# Patient Record
Sex: Male | Born: 1980 | Race: Black or African American | Hispanic: No | Marital: Single | Smoking: Current every day smoker
Health system: Southern US, Community
[De-identification: ages and names within clinical notes are randomized; demographics above are authoritative.]

## PROBLEM LIST (undated history)

## (undated) ENCOUNTER — Emergency Department (HOSPITAL_COMMUNITY): Disposition: A | Discharge status: Home / Self Care | Payer: Self-pay

## (undated) DIAGNOSIS — G4730 Sleep apnea, unspecified: Secondary | ICD-10-CM

---

## 2011-10-13 ENCOUNTER — Encounter: Payer: Self-pay | Admitting: Emergency Medicine

## 2011-10-13 ENCOUNTER — Emergency Department (HOSPITAL_COMMUNITY)
Admission: EM | Admit: 2011-10-13 | Discharge: 2011-10-14 | Discharge status: Against Medical Advice | Payer: Self-pay | Attending: Emergency Medicine | Admitting: Emergency Medicine

## 2011-10-13 DIAGNOSIS — Z0389 Encounter for observation for other suspected diseases and conditions ruled out: Secondary | ICD-10-CM | POA: Insufficient documentation
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
## 2011-10-14 ENCOUNTER — Ambulatory Visit (HOSPITAL_COMMUNITY): Admission: RE | Admit: 2011-10-14 | Payer: Self-pay | Source: Ambulatory Visit

## 2011-10-29 ENCOUNTER — Encounter (HOSPITAL_COMMUNITY): Payer: Self-pay | Admitting: *Deleted

## 2011-10-29 ENCOUNTER — Emergency Department (HOSPITAL_COMMUNITY): Payer: Self-pay

## 2011-10-29 ENCOUNTER — Emergency Department (HOSPITAL_COMMUNITY)
Admission: EM | Admit: 2011-10-29 | Discharge: 2011-10-29 | Disposition: A | Discharge status: Home / Self Care | Payer: Self-pay | Attending: Emergency Medicine | Admitting: Emergency Medicine

## 2011-10-29 DIAGNOSIS — F17200 Nicotine dependence, unspecified, uncomplicated: Secondary | ICD-10-CM | POA: Insufficient documentation

## 2011-10-29 DIAGNOSIS — S62309A Unspecified fracture of unspecified metacarpal bone, initial encounter for closed fracture: Secondary | ICD-10-CM

## 2011-10-29 DIAGNOSIS — M79609 Pain in unspecified limb: Secondary | ICD-10-CM | POA: Insufficient documentation

## 2011-10-29 DIAGNOSIS — M7989 Other specified soft tissue disorders: Secondary | ICD-10-CM | POA: Insufficient documentation

## 2011-10-29 DIAGNOSIS — S62319A Displaced fracture of base of unspecified metacarpal bone, initial encounter for closed fracture: Secondary | ICD-10-CM | POA: Insufficient documentation

## 2011-10-29 MED ORDER — OXYCODONE-ACETAMINOPHEN 5-325 MG PO TABS: 2.0000 | ORAL_TABLET | ORAL | Status: DC | PRN

## 2011-10-29 NOTE — ED Notes (Signed)
Returned from xray
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
## 2011-10-29 NOTE — ED Notes (Signed)
Assumed care of pt.  No distress noted.  Pt noted to be walking around dept without difficulty.  Pt updated on POC.
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
## 2011-10-30 ENCOUNTER — Encounter (HOSPITAL_COMMUNITY): Payer: Self-pay | Admitting: Certified Registered Nurse Anesthetist

## 2011-10-30 ENCOUNTER — Emergency Department (HOSPITAL_COMMUNITY)
Admission: EM | Admit: 2011-10-30 | Discharge: 2011-10-30 | Disposition: A | Discharge status: Home / Self Care | Payer: Self-pay | Attending: Orthopedic Surgery | Admitting: Orthopedic Surgery

## 2011-10-30 ENCOUNTER — Emergency Department (HOSPITAL_COMMUNITY): Payer: Self-pay | Admitting: Certified Registered Nurse Anesthetist

## 2011-10-30 ENCOUNTER — Encounter (HOSPITAL_COMMUNITY): Admission: EM | Disposition: A | Discharge status: Home / Self Care | Payer: Self-pay | Source: Home / Self Care

## 2011-10-30 ENCOUNTER — Inpatient Hospital Stay: Admission: AD | Admit: 2011-10-30 | Payer: Self-pay | Source: Ambulatory Visit | Admitting: Orthopedic Surgery

## 2011-10-30 DIAGNOSIS — S62309A Unspecified fracture of unspecified metacarpal bone, initial encounter for closed fracture: Secondary | ICD-10-CM

## 2011-10-30 DIAGNOSIS — M79609 Pain in unspecified limb: Secondary | ICD-10-CM | POA: Insufficient documentation

## 2011-10-30 DIAGNOSIS — S62319A Displaced fracture of base of unspecified metacarpal bone, initial encounter for closed fracture: Secondary | ICD-10-CM

## 2011-10-30 SURGERY — OPEN REDUCTION INTERNAL FIXATION (ORIF) HAND
Anesthesia: General | Site: Hand | Laterality: Left | Wound class: Clean

## 2011-10-30 MED ORDER — DEXAMETHASONE SODIUM PHOSPHATE 4 MG/ML IJ SOLN
INTRAMUSCULAR | Status: DC | PRN
  Administered 2011-10-30: 10 mg via INTRAVENOUS

## 2011-10-30 MED ORDER — 0.9 % SODIUM CHLORIDE (POUR BTL) OPTIME
TOPICAL | Status: DC | PRN
  Administered 2011-10-30: 1000 mL

## 2011-10-30 MED ORDER — FENTANYL CITRATE 0.05 MG/ML IJ SOLN: 25.0000 ug | INTRAMUSCULAR | Status: DC | PRN

## 2011-10-30 MED ORDER — DOCUSATE SODIUM 100 MG PO CAPS: 100.0000 mg | ORAL_CAPSULE | Freq: Two times a day (BID) | ORAL | Status: AC

## 2011-10-30 MED ORDER — CEFAZOLIN SODIUM-DEXTROSE 2-3 GM-% IV SOLR
2.0000 g | Freq: Once | INTRAVENOUS | Status: DC
  Filled 2011-10-30: qty 50

## 2011-10-30 MED ORDER — LACTATED RINGERS IV SOLN
INTRAVENOUS | Status: DC | PRN
  Administered 2011-10-30: 10:00:00 via INTRAVENOUS

## 2011-10-30 MED ORDER — EPHEDRINE SULFATE 50 MG/ML IJ SOLN
INTRAMUSCULAR | Status: DC | PRN
  Administered 2011-10-30 (×2): 10 mg via INTRAVENOUS

## 2011-10-30 MED ORDER — MIDAZOLAM HCL 5 MG/5ML IJ SOLN
INTRAMUSCULAR | Status: DC | PRN
  Administered 2011-10-30: 2 mg via INTRAVENOUS
  Administered 2011-10-30: 1 mg via INTRAVENOUS

## 2011-10-30 MED ORDER — SODIUM CHLORIDE 0.9 % IV SOLN
10.0000 mg | INTRAVENOUS | Status: DC | PRN
  Administered 2011-10-30: 80 ug/min via INTRAVENOUS

## 2011-10-30 MED ORDER — PHENYLEPHRINE HCL 10 MG/ML IJ SOLN
INTRAMUSCULAR | Status: DC | PRN
  Administered 2011-10-30 (×3): 120 ug via INTRAVENOUS

## 2011-10-30 MED ORDER — METOCLOPRAMIDE HCL 5 MG/ML IJ SOLN
10.0000 mg | Freq: Once | INTRAMUSCULAR | Status: DC | PRN
  Filled 2011-10-30: qty 2

## 2011-10-30 MED ORDER — ONDANSETRON HCL 4 MG/2ML IJ SOLN
INTRAMUSCULAR | Status: DC | PRN
  Administered 2011-10-30: 4 mg via INTRAVENOUS

## 2011-10-30 MED ORDER — LIDOCAINE HCL 1 % IJ SOLN
INTRAMUSCULAR | Status: DC | PRN
  Administered 2011-10-30: 1 mL via INTRADERMAL

## 2011-10-30 MED ORDER — OXYCODONE-ACETAMINOPHEN 10-325 MG PO TABS: 1.0000 | ORAL_TABLET | ORAL | Status: AC | PRN

## 2011-10-30 MED ORDER — LACTATED RINGERS IV SOLN
INTRAVENOUS | Status: DC | PRN
  Administered 2011-10-30 (×2): via INTRAVENOUS

## 2011-10-30 MED ORDER — FENTANYL CITRATE 0.05 MG/ML IJ SOLN
INTRAMUSCULAR | Status: DC | PRN
  Administered 2011-10-30 (×2): 50 ug via INTRAVENOUS
  Administered 2011-10-30: 100 ug via INTRAVENOUS

## 2011-10-30 MED ORDER — ROPIVACAINE HCL 5 MG/ML IJ SOLN
INTRAMUSCULAR | Status: DC | PRN
  Administered 2011-10-30: 30 mL via EPIDURAL

## 2011-10-30 MED ORDER — ALBUTEROL SULFATE HFA 108 (90 BASE) MCG/ACT IN AERS
INHALATION_SPRAY | RESPIRATORY_TRACT | Status: DC | PRN
  Administered 2011-10-30: 4 via RESPIRATORY_TRACT

## 2011-10-30 MED ORDER — MORPHINE SULFATE 2 MG/ML IJ SOLN: 0.0500 mg/kg | INTRAMUSCULAR | Status: DC | PRN

## 2011-10-30 SURGICAL SUPPLY — 68 items
BANDAGE ELASTIC 3 VELCRO ST LF (GAUZE/BANDAGES/DRESSINGS) ×3 IMPLANT
BANDAGE ELASTIC 4 VELCRO ST LF (GAUZE/BANDAGES/DRESSINGS) ×3 IMPLANT
BANDAGE GAUZE ELAST BULKY 4 IN (GAUZE/BANDAGES/DRESSINGS) ×3 IMPLANT
BIT DRILL 2 FAST STEP (BIT) ×2 IMPLANT
BIT DRILL 2 MINI QC DISP (BIT) ×2 IMPLANT
BLADE SURG ROTATE 9660 (MISCELLANEOUS) IMPLANT
BNDG CMPR 9X4 STRL LF SNTH (GAUZE/BANDAGES/DRESSINGS) ×2
BNDG ESMARK 4X9 LF (GAUZE/BANDAGES/DRESSINGS) ×3 IMPLANT
CLOTH BEACON ORANGE TIMEOUT ST (SAFETY) ×3 IMPLANT
CORDS BIPOLAR (ELECTRODE) ×3 IMPLANT
COVER SURGICAL LIGHT HANDLE (MISCELLANEOUS) ×3 IMPLANT
CUFF TOURNIQUET SINGLE 18IN (TOURNIQUET CUFF) ×3 IMPLANT
CUFF TOURNIQUET SINGLE 24IN (TOURNIQUET CUFF) IMPLANT
DRAIN TLS ROUND 10FR (DRAIN) IMPLANT
DRAPE OEC MINIVIEW 54X84 (DRAPES) ×3 IMPLANT
DRAPE SURG 17X11 SM STRL (DRAPES) ×3 IMPLANT
DRSG ADAPTIC 3X8 NADH LF (GAUZE/BANDAGES/DRESSINGS) ×3 IMPLANT
DRSG EMULSION OIL 3X3 NADH (GAUZE/BANDAGES/DRESSINGS) ×2 IMPLANT
ELECT REM PT RETURN 9FT ADLT (ELECTROSURGICAL)
ELECTRODE REM PT RTRN 9FT ADLT (ELECTROSURGICAL) IMPLANT
GAUZE SPONGE 4X4 12PLY STRL LF (GAUZE/BANDAGES/DRESSINGS) ×2 IMPLANT
GAUZE SPONGE 4X4 16PLY XRAY LF (GAUZE/BANDAGES/DRESSINGS) ×3 IMPLANT
GLOVE BIOGEL PI IND STRL 8.5 (GLOVE) ×2 IMPLANT
GLOVE BIOGEL PI INDICATOR 8.5 (GLOVE) ×1
GLOVE SURG ORTHO 8.0 STRL STRW (GLOVE) ×7 IMPLANT
GOWN PREVENTION PLUS XLARGE (GOWN DISPOSABLE) ×7 IMPLANT
GOWN STRL NON-REIN LRG LVL3 (GOWN DISPOSABLE) ×3 IMPLANT
K-WIRE .062 (WIRE) ×3
K-WIRE FX6X.062X2 END TROC (WIRE) ×2
KIT BASIN OR (CUSTOM PROCEDURE TRAY) ×3 IMPLANT
KIT ROOM TURNOVER OR (KITS) ×3 IMPLANT
KWIRE FX6X.062X2 END TROC (WIRE) ×2 IMPLANT
MANIFOLD NEPTUNE II (INSTRUMENTS) ×3 IMPLANT
NDL HYPO 25X1 1.5 SAFETY (NEEDLE) ×1 IMPLANT
NEEDLE HYPO 25X1 1.5 SAFETY (NEEDLE) IMPLANT
NS IRRIG 1000ML POUR BTL (IV SOLUTION) ×3 IMPLANT
PACK ORTHO EXTREMITY (CUSTOM PROCEDURE TRAY) ×3 IMPLANT
PAD ARMBOARD 7.5X6 YLW CONV (MISCELLANEOUS) ×6 IMPLANT
PAD CAST 4YDX4 CTTN HI CHSV (CAST SUPPLIES) ×2 IMPLANT
PADDING CAST ABS 4INX4YD NS (CAST SUPPLIES) ×1
PADDING CAST ABS COTTON 4X4 ST (CAST SUPPLIES) ×2 IMPLANT
PADDING CAST COTTON 4X4 STRL (CAST SUPPLIES) ×3
PLATE T SHAPE LOCKING 2.5MM (Plate) ×2 IMPLANT
SCREW PEG 15MM (Screw) ×2 IMPLANT
SCREW PEG 2.5X14 NONLOCK (Screw) ×2 IMPLANT
SCREW PEG 2.5X16 NONLOCK (Screw) ×3 IMPLANT
SCREW PEG 2.5X18 NONLOCK (Screw) ×2 IMPLANT
SCREW PEG 2.5X24 NONLOCK (Screw) ×4 IMPLANT
SCREW PEG LOCK 2.5X12 (Screw) ×3 IMPLANT
SCREW PEG LOCK 2.5X20 (Peg) ×2 IMPLANT
SOAP 2 % CHG 4 OZ (WOUND CARE) ×3 IMPLANT
SPLINT FIBERGLASS 4X30 (CAST SUPPLIES) ×2 IMPLANT
SPONGE GAUZE 4X4 12PLY (GAUZE/BANDAGES/DRESSINGS) ×3 IMPLANT
STRIP CLOSURE SKIN 1/2X4 (GAUZE/BANDAGES/DRESSINGS) IMPLANT
SUCTION FRAZIER TIP 10 FR DISP (SUCTIONS) ×3 IMPLANT
SUT ETHILON 4 0 PS 2 18 (SUTURE) ×3 IMPLANT
SUT MNCRL AB 4-0 PS2 18 (SUTURE) ×2 IMPLANT
SUT PROLENE 4 0 PS 2 18 (SUTURE) ×3 IMPLANT
SUT VIC AB 2-0 FS1 27 (SUTURE) ×3 IMPLANT
SUT VICRYL 4-0 PS2 18IN ABS (SUTURE) ×3 IMPLANT
SYR CONTROL 10ML LL (SYRINGE) IMPLANT
SYSTEM CHEST DRAIN TLS 7FR (DRAIN) IMPLANT
TOWEL OR 17X24 6PK STRL BLUE (TOWEL DISPOSABLE) ×3 IMPLANT
TOWEL OR 17X26 10 PK STRL BLUE (TOWEL DISPOSABLE) ×3 IMPLANT
TUBE CONNECTING 12X1/4 (SUCTIONS) ×3 IMPLANT
WASHER 2.5 THREADED (Orthopedic Implant) ×2 IMPLANT
WATER STERILE IRR 1000ML POUR (IV SOLUTION) ×1 IMPLANT
YANKAUER SUCT BULB TIP NO VENT (SUCTIONS) IMPLANT

## 2011-10-30 NOTE — Transfer of Care (Signed)
Immediate Anesthesia Transfer of Care Note  Patient: Connor Porter  Procedure(s) Performed:  OPEN REDUCTION INTERNAL FIXATION (ORIF) HAND  Patient Location: PACU  Anesthesia Type: General  Level of Consciousness: awake, alert , oriented and patient cooperative  Airway & Oxygen Therapy: Patient Spontanous Breathing and Patient connected to nasal cannula oxygen  Post-op Assessment: Report given to PACU RN and Post -op Vital signs reviewed and stable  Post vital signs: Reviewed and stable  Complications: No apparent anesthesia complications

## 2011-10-30 NOTE — Brief Op Note (Signed)
10/30/2011  12:47 PM  PATIENT:  Connor Porter  30 y.o. male  PRE-OPERATIVE DIAGNOSIS:  Left hand Fracture  POST-OPERATIVE DIAGNOSIS:  Left hand fracture  PROCEDURE:  Procedure(s): OPEN REDUCTION INTERNAL FIXATION (ORIF) HAND  SURGEON:  Surgeon(s): Sharma Covert  PHYSICIAN ASSISTANT:   ASSISTANTS: none   ANESTHESIA:   general  EBL:  Total I/O In: 1000 [I.V.:1000] Out: -   BLOOD ADMINISTERED:none  DRAINS: none   LOCAL MEDICATIONS USED:  NONE  SPECIMEN:  No Specimen  DISPOSITION OF SPECIMEN:  N/A  COUNTS:  YES  TOURNIQUET:  * Missing tourniquet times found for documented tourniquets in log:  [id number] *  DICTATION: .typed in epic  PLAN OF CARE: Discharge to home after PACU  PATIENT DISPOSITION:  PACU - hemodynamically stable.   Delay start of Pharmacological VTE agent (>24hrs) due to surgical blood loss or risk of bleeding:  {YES/NO/NOT APPLICABLE:20182
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
## 2011-10-30 NOTE — Anesthesia Postprocedure Evaluation (Signed)
  Anesthesia Post Note  Patient: Connor Porter  Procedure(s) Performed:  OPEN REDUCTION INTERNAL FIXATION (ORIF) HAND  Anesthesia type: General  Patient location: PACU  Post pain: Pain level controlled  Post assessment: Patient's Cardiovascular Status Stable  Last Vitals:  Filed Vitals:   10/30/11 1300  Pulse: 90  Temp: 36.9 C  Resp: 21    Post vital signs: Reviewed and stable  Level of consciousness: alert  Complications: No apparent anesthesia complications

## 2011-10-30 NOTE — Anesthesia Procedure Notes (Addendum)
Anesthesia Regional Block:  Supraclavicular block  Pre-Anesthetic Checklist: ,, timeout performed, Correct Patient, Correct Site, Correct Laterality, Correct Procedure, Correct Position, site marked, Risks and benefits discussed,  Surgical consent,  Pre-op evaluation,  At surgeon's request and post-op pain management  Laterality: Left  Prep: chloraprep       Needles:   Needle Type: Other   (Arrow Echogenic)   Needle Length: 9cm  Needle Gauge: 21    Additional Needles:  Procedures: ultrasound guided Supraclavicular block Narrative:  Start time: 10/30/2011 10:05 AM End time: 10/30/2011 10:14 AM Injection made incrementally with aspirations every 5 mL.  Performed by: Personally  Anesthesiologist: C Frederick  Additional Notes: Ultrasound guidance used to: id relevant anatomy, confirm needle position, local anesthetic spread, avoidance of vascular puncture. Picture saved. No complications. Block performed personally by Janetta Hora. Gelene Mink, MD    Supraclavicular block Procedure Name: LMA Insertion Date/Time: 10/30/2011 10:48 AM Performed by: Benedetto Goad Pre-anesthesia Checklist: Patient identified, Emergency Drugs available, Suction available, Patient being monitored and Timeout performed Patient Re-evaluated:Patient Re-evaluated prior to inductionOxygen Delivery Method: Circle System Utilized Preoxygenation: Pre-oxygenation with 100% oxygen Intubation Type: IV induction LMA: LMA with gastric port inserted LMA Size: 5.0 Number of attempts: 1 Tube secured with: Tape Dental Injury: Teeth and Oropharynx as per pre-operative assessment

## 2011-10-30 NOTE — Anesthesia Preprocedure Evaluation (Addendum)
Anesthesia Evaluation  Patient identified by MRN, date of birth, ID band Patient awake    Reviewed: Allergy & Precautions, H&P , NPO status , Patient's Chart, lab work & pertinent test results, reviewed documented beta blocker date and time   Airway Mallampati: II TM Distance: >3 FB Neck ROM: full    Dental  (+) Dental Advisory Given   Pulmonary neg pulmonary ROS, Current Smoker (ppd),          Cardiovascular neg cardio ROS     Neuro/Psych Negative Neurological ROS  Negative Psych ROS   GI/Hepatic negative GI ROS, Neg liver ROS,   Endo/Other  Negative Endocrine ROS  Renal/GU negative Renal ROS  Genitourinary negative   Musculoskeletal   Abdominal   Peds  Hematology negative hematology ROS (+)   Anesthesia Other Findings See surgeon's H&P   Reproductive/Obstetrics negative OB ROS                         Anesthesia Physical Anesthesia Plan  ASA: I  Anesthesia Plan: General   Post-op Pain Management: MAC Combined w/ Regional for Post-op pain   Induction: Intravenous  Airway Management Planned: LMA  Additional Equipment:   Intra-op Plan:   Post-operative Plan: Extubation in OR  Informed Consent: I have reviewed the patients History and Physical, chart, labs and discussed the procedure including the risks, benefits and alternatives for the proposed anesthesia with the patient or authorized representative who has indicated his/her understanding and acceptance.     Plan Discussed with: CRNA, Surgeon and Anesthesiologist  Anesthesia Plan Comments:        Anesthesia Quick Evaluation

## 2011-10-30 NOTE — Preoperative (Signed)
Beta Blockers   Reason not to administer Beta Blockers:Not Applicable 

## 2011-10-31 MED FILL — Cefazolin in D5W Inj 1 GM/50ML: INTRAVENOUS | Qty: 100 | Status: AC

## 2019-08-06 ENCOUNTER — Encounter (HOSPITAL_COMMUNITY): Payer: Self-pay | Admitting: Emergency Medicine

## 2019-08-06 ENCOUNTER — Emergency Department (HOSPITAL_COMMUNITY): Payer: Self-pay

## 2019-08-06 ENCOUNTER — Emergency Department (HOSPITAL_COMMUNITY)
Admission: EM | Admit: 2019-08-06 | Discharge: 2019-08-07 | Disposition: A | Discharge status: Home / Self Care | Payer: Self-pay | Attending: Emergency Medicine | Admitting: Emergency Medicine

## 2019-08-06 ENCOUNTER — Other Ambulatory Visit: Payer: Self-pay

## 2019-08-06 DIAGNOSIS — Y929 Unspecified place or not applicable: Secondary | ICD-10-CM | POA: Insufficient documentation

## 2019-08-06 DIAGNOSIS — S61412A Laceration without foreign body of left hand, initial encounter: Secondary | ICD-10-CM | POA: Insufficient documentation

## 2019-08-06 DIAGNOSIS — S60222A Contusion of left hand, initial encounter: Secondary | ICD-10-CM

## 2019-08-06 DIAGNOSIS — Y939 Activity, unspecified: Secondary | ICD-10-CM | POA: Insufficient documentation

## 2019-08-06 DIAGNOSIS — W51XXXA Accidental striking against or bumped into by another person, initial encounter: Secondary | ICD-10-CM | POA: Insufficient documentation

## 2019-08-06 DIAGNOSIS — S61422A Laceration with foreign body of left hand, initial encounter: Secondary | ICD-10-CM

## 2019-08-06 DIAGNOSIS — F17210 Nicotine dependence, cigarettes, uncomplicated: Secondary | ICD-10-CM | POA: Insufficient documentation

## 2019-08-06 DIAGNOSIS — Y999 Unspecified external cause status: Secondary | ICD-10-CM | POA: Insufficient documentation
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
## 2019-08-07 MED ORDER — ACETAMINOPHEN 500 MG PO TABS
1000.0000 mg | ORAL_TABLET | Freq: Once | ORAL | Status: AC
  Administered 2019-08-07: 1000 mg via ORAL
  Filled 2019-08-07: qty 2

## 2019-08-07 MED ORDER — BACITRACIN ZINC 500 UNIT/GM EX OINT
TOPICAL_OINTMENT | Freq: Two times a day (BID) | CUTANEOUS | Status: DC
  Administered 2019-08-07: 1 via TOPICAL
  Filled 2019-08-07: qty 0.9

## 2019-08-07 MED ORDER — NAPROXEN 250 MG PO TABS
500.0000 mg | ORAL_TABLET | Freq: Once | ORAL | Status: AC
  Administered 2019-08-07: 500 mg via ORAL
  Filled 2019-08-07: qty 2

## 2019-08-07 MED ORDER — AMOXICILLIN-POT CLAVULANATE 875-125 MG PO TABS: 1.0000 | ORAL_TABLET | Freq: Two times a day (BID) | ORAL | 0 refills | Status: DC

## 2019-08-07 MED ORDER — TETANUS-DIPHTH-ACELL PERTUSSIS 5-2.5-18.5 LF-MCG/0.5 IM SUSP: 0.5000 mL | Freq: Once | INTRAMUSCULAR | Status: DC
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
## 2019-08-07 NOTE — Progress Notes (Signed)
Orthopedic Tech Progress Note Patient Details:  GABOR LUSK 1981/08/15 [id number]  Ortho Devices Type of Ortho Device: Arm sling, Ulna gutter splint Ortho Device/Splint Location: lue Ortho Device/Splint Interventions: Ordered, Application, Adjustment   Post Interventions Patient Tolerated: Well Instructions Provided: Care of device, Adjustment of device   Karolee Stamps 08/07/2019, 5:33 AM

## 2019-08-07 NOTE — Discharge Instructions (Signed)
You were seen in the ER for left hand and finger pain.  X-ray did not show any fractures but there was a lot of swelling.  Given the amount of pain you had, decreased range of motion it is possible a very subtle fracture was missed on the initial x-ray as we did today.  We will place you in a splint to keep these joints stabilized and secure.  Follow-up with hand surgery for reevaluation and possible repeat x-rays.  There is no need to put stitches in the wound because the wound is mostly already closed.  There is also a lot of swelling and some discharge from the wound.  We will treat you with antibiotics to prevent infection.  Ice, elevate.For pain and inflammation you can use a combination of ibuprofen and acetaminophen.  Take [phone] mg acetaminophen (tylenol) every 6 hours or 600 mg ibuprofen (advil, motrin) every 6 hours.  You can take these separately or combine them every 6 hours for maximum pain control. Do not exceed 4,000 mg acetaminophen or 2,400 mg ibuprofen in a 24 hour period.  Do not take ibuprofen containing products if you have history of kidney disease, ulcers, GI bleeding, severe acid reflux, or take a blood thinner.  Do not take acetaminophen if you have liver disease.

## 2019-09-04 ENCOUNTER — Emergency Department (HOSPITAL_COMMUNITY): Admission: EM | Admit: 2019-09-04 | Discharge: 2019-09-04 | Discharge status: Against Medical Advice | Payer: Self-pay

## 2019-09-04 NOTE — ED Notes (Signed)
No answer when called for triage x2 

## 2019-09-04 NOTE — ED Notes (Signed)
No answer for triage x1 

## 2019-09-05 ENCOUNTER — Emergency Department (HOSPITAL_COMMUNITY): Payer: Self-pay

## 2019-09-05 ENCOUNTER — Encounter (HOSPITAL_COMMUNITY): Payer: Self-pay | Admitting: Emergency Medicine

## 2019-09-05 ENCOUNTER — Inpatient Hospital Stay (HOSPITAL_COMMUNITY): Payer: Self-pay | Admitting: Certified Registered Nurse Anesthetist

## 2019-09-05 ENCOUNTER — Other Ambulatory Visit: Payer: Self-pay

## 2019-09-05 ENCOUNTER — Inpatient Hospital Stay (HOSPITAL_COMMUNITY)
Admission: EM | Admit: 2019-09-05 | Discharge: 2019-09-08 | DRG: 513 | Disposition: A | Discharge status: Home / Self Care | Payer: Self-pay | Attending: Family Medicine | Admitting: Family Medicine

## 2019-09-05 ENCOUNTER — Encounter (HOSPITAL_COMMUNITY)
Admission: EM | Disposition: A | Discharge status: Home / Self Care | Payer: Self-pay | Source: Home / Self Care | Attending: Family Medicine

## 2019-09-05 DIAGNOSIS — F17200 Nicotine dependence, unspecified, uncomplicated: Secondary | ICD-10-CM | POA: Diagnosis present

## 2019-09-05 DIAGNOSIS — L03114 Cellulitis of left upper limb: Secondary | ICD-10-CM | POA: Diagnosis present

## 2019-09-05 DIAGNOSIS — Z833 Family history of diabetes mellitus: Secondary | ICD-10-CM

## 2019-09-05 DIAGNOSIS — Z20828 Contact with and (suspected) exposure to other viral communicable diseases: Secondary | ICD-10-CM | POA: Diagnosis present

## 2019-09-05 DIAGNOSIS — Z720 Tobacco use: Secondary | ICD-10-CM

## 2019-09-05 DIAGNOSIS — W503XXA Accidental bite by another person, initial encounter: Secondary | ICD-10-CM | POA: Diagnosis present

## 2019-09-05 DIAGNOSIS — G4730 Sleep apnea, unspecified: Secondary | ICD-10-CM | POA: Diagnosis present

## 2019-09-05 DIAGNOSIS — S61452A Open bite of left hand, initial encounter: Secondary | ICD-10-CM | POA: Diagnosis present

## 2019-09-05 DIAGNOSIS — M869 Osteomyelitis, unspecified: Secondary | ICD-10-CM

## 2019-09-05 DIAGNOSIS — M868X4 Other osteomyelitis, hand: Principal | ICD-10-CM | POA: Diagnosis present

## 2019-09-05 DIAGNOSIS — R030 Elevated blood-pressure reading, without diagnosis of hypertension: Secondary | ICD-10-CM | POA: Diagnosis present

## 2019-09-05 DIAGNOSIS — L02512 Cutaneous abscess of left hand: Secondary | ICD-10-CM | POA: Diagnosis present

## 2019-09-05 DIAGNOSIS — L0390 Cellulitis, unspecified: Secondary | ICD-10-CM

## 2019-09-05 DIAGNOSIS — M009 Pyogenic arthritis, unspecified: Secondary | ICD-10-CM | POA: Diagnosis present

## 2019-09-05 HISTORY — DX: Sleep apnea, unspecified: G47.30

## 2019-09-05 HISTORY — PX: I&D EXTREMITY: SHX5045

## 2019-09-05 LAB — COMPREHENSIVE METABOLIC PANEL WITH GFR
ALT: 34 U/L (ref 0–44)
AST: 20 U/L (ref 15–41)
Albumin: 3.6 g/dL (ref 3.5–5.0)
Alkaline Phosphatase: 87 U/L (ref 38–126)
Anion gap: 11 (ref 5–15)
BUN: 14 mg/dL (ref 6–20)
CO2: 22 mmol/L (ref 22–32)
Calcium: 9 mg/dL (ref 8.9–10.3)
Chloride: 106 mmol/L (ref 98–111)
Creatinine, Ser: 1.05 mg/dL (ref 0.61–1.24)
GFR calc Af Amer: >60 mL/min
GFR calc non Af Amer: >60 mL/min
Glucose, Bld: 138 mg/dL — ABNORMAL HIGH (ref 70–99)
Potassium: 3.6 mmol/L (ref 3.5–5.1)
Sodium: 139 mmol/L (ref 135–145)
Total Bilirubin: 0.4 mg/dL (ref 0.3–1.2)
Total Protein: 6.8 g/dL (ref 6.5–8.1)

## 2019-09-05 LAB — CBC WITH DIFFERENTIAL/PLATELET
Abs Immature Granulocytes: 0.01 K/uL (ref 0.00–0.07)
Basophils Absolute: 0 K/uL (ref 0.0–0.1)
Basophils Relative: 0 %
Eosinophils Absolute: 0.2 K/uL (ref 0.0–0.5)
Eosinophils Relative: 3 %
HCT: 42.8 % (ref 39.0–52.0)
Hemoglobin: 14.7 g/dL (ref 13.0–17.0)
Immature Granulocytes: 0 %
Lymphocytes Relative: 31 %
Lymphs Abs: 1.9 K/uL (ref 0.7–4.0)
MCH: 32.5 pg (ref 26.0–34.0)
MCHC: 34.3 g/dL (ref 30.0–36.0)
MCV: 94.5 fL (ref 80.0–100.0)
Monocytes Absolute: 0.6 K/uL (ref 0.1–1.0)
Monocytes Relative: 10 %
Neutro Abs: 3.5 K/uL (ref 1.7–7.7)
Neutrophils Relative %: 56 %
Platelets: 340 K/uL (ref 150–400)
RBC: 4.53 MIL/uL (ref 4.22–5.81)
RDW: 14.1 % (ref 11.5–15.5)
WBC: 6.2 K/uL (ref 4.0–10.5)
nRBC: 0 % (ref 0.0–0.2)

## 2019-09-05 LAB — SARS CORONAVIRUS 2 BY RT PCR (HOSPITAL ORDER, PERFORMED IN ~~LOC~~ HOSPITAL LAB): SARS Coronavirus 2: NEGATIVE

## 2019-09-05 LAB — LACTIC ACID, PLASMA
Lactic Acid, Venous: 1.1 mmol/L (ref 0.5–1.9)
Lactic Acid, Venous: 1.4 mmol/L (ref 0.5–1.9)

## 2019-09-05 LAB — HIV ANTIBODY (ROUTINE TESTING W REFLEX): HIV Screen 4th Generation wRfx: NONREACTIVE

## 2019-09-05 SURGERY — IRRIGATION AND DEBRIDEMENT EXTREMITY
Anesthesia: General | Laterality: Left

## 2019-09-05 MED ORDER — PHENYLEPHRINE HCL (PRESSORS) 10 MG/ML IV SOLN
INTRAVENOUS | Status: DC | PRN
  Administered 2019-09-05: 80 ug via INTRAVENOUS
  Administered 2019-09-05: 120 ug via INTRAVENOUS
  Administered 2019-09-05: 80 ug via INTRAVENOUS
  Administered 2019-09-05: 120 ug via INTRAVENOUS

## 2019-09-05 MED ORDER — PROPOFOL 10 MG/ML IV BOLUS
INTRAVENOUS | Status: DC | PRN
  Administered 2019-09-05: 50 mg via INTRAVENOUS
  Administered 2019-09-05: 200 mg via INTRAVENOUS
  Administered 2019-09-05: 100 mg via INTRAVENOUS

## 2019-09-05 MED ORDER — MORPHINE SULFATE (PF) 2 MG/ML IV SOLN
0.5000 mg | INTRAVENOUS | Status: DC | PRN
  Administered 2019-09-05: 1 mg via INTRAVENOUS
  Filled 2019-09-05: qty 1

## 2019-09-05 MED ORDER — BUPIVACAINE HCL 0.25 % IJ SOLN
INTRAMUSCULAR | Status: DC | PRN
  Administered 2019-09-05: 10 mL

## 2019-09-05 MED ORDER — BUPIVACAINE HCL (PF) 0.25 % IJ SOLN
INTRAMUSCULAR | Status: AC
  Filled 2019-09-05: qty 30

## 2019-09-05 MED ORDER — VANCOMYCIN HCL 10 G IV SOLR
2000.0000 mg | Freq: Once | INTRAVENOUS | Status: AC
  Administered 2019-09-05: 2000 mg via INTRAVENOUS
  Filled 2019-09-05: qty 2000

## 2019-09-05 MED ORDER — NAPROXEN 250 MG PO TABS
250.0000 mg | ORAL_TABLET | Freq: Two times a day (BID) | ORAL | Status: DC
  Administered 2019-09-06 – 2019-09-08 (×6): 250 mg via ORAL
  Filled 2019-09-05 (×7): qty 1

## 2019-09-05 MED ORDER — FENTANYL CITRATE (PF) 100 MCG/2ML IJ SOLN
INTRAMUSCULAR | Status: DC | PRN
  Administered 2019-09-05 (×4): 50 ug via INTRAVENOUS

## 2019-09-05 MED ORDER — LACTATED RINGERS IV SOLN: INTRAVENOUS | Status: DC

## 2019-09-05 MED ORDER — SUGAMMADEX SODIUM 200 MG/2ML IV SOLN
INTRAVENOUS | Status: DC | PRN
  Administered 2019-09-05: 200 mg via INTRAVENOUS

## 2019-09-05 MED ORDER — FENTANYL CITRATE (PF) 250 MCG/5ML IJ SOLN
INTRAMUSCULAR | Status: AC
  Filled 2019-09-05: qty 5

## 2019-09-05 MED ORDER — ALBUTEROL SULFATE HFA 108 (90 BASE) MCG/ACT IN AERS
INHALATION_SPRAY | RESPIRATORY_TRACT | Status: AC
  Filled 2019-09-05: qty 6.7

## 2019-09-05 MED ORDER — LACTATED RINGERS IV SOLN
INTRAVENOUS | Status: DC
  Administered 2019-09-05: 17:00:00 via INTRAVENOUS

## 2019-09-05 MED ORDER — DIPHENHYDRAMINE HCL 25 MG PO CAPS: 25.0000 mg | ORAL_CAPSULE | Freq: Four times a day (QID) | ORAL | Status: DC | PRN

## 2019-09-05 MED ORDER — FENTANYL CITRATE (PF) 100 MCG/2ML IJ SOLN
INTRAMUSCULAR | Status: AC
  Filled 2019-09-05: qty 2

## 2019-09-05 MED ORDER — ONDANSETRON HCL 4 MG/2ML IJ SOLN: 4.0000 mg | Freq: Four times a day (QID) | INTRAMUSCULAR | Status: DC | PRN

## 2019-09-05 MED ORDER — MIDAZOLAM HCL 2 MG/2ML IJ SOLN
INTRAMUSCULAR | Status: AC
  Filled 2019-09-05: qty 2

## 2019-09-05 MED ORDER — METHOCARBAMOL 1000 MG/10ML IJ SOLN
500.0000 mg | Freq: Four times a day (QID) | INTRAVENOUS | Status: DC | PRN
  Filled 2019-09-05: qty 5

## 2019-09-05 MED ORDER — PROMETHAZINE HCL 25 MG/ML IJ SOLN: 6.2500 mg | INTRAMUSCULAR | Status: DC | PRN

## 2019-09-05 MED ORDER — VITAMIN C 500 MG PO TABS
1000.0000 mg | ORAL_TABLET | Freq: Every day | ORAL | Status: DC
  Administered 2019-09-05 – 2019-09-08 (×4): 1000 mg via ORAL
  Filled 2019-09-05 (×4): qty 2

## 2019-09-05 MED ORDER — VANCOMYCIN HCL 1000 MG IV SOLR
INTRAVENOUS | Status: DC | PRN
  Administered 2019-09-05: 19:00:00 1000 mg via INTRAVENOUS

## 2019-09-05 MED ORDER — 0.9 % SODIUM CHLORIDE (POUR BTL) OPTIME
TOPICAL | Status: DC | PRN
  Administered 2019-09-05: 1000 mL

## 2019-09-05 MED ORDER — SODIUM CHLORIDE 0.9 % IV SOLN
3.0000 g | Freq: Four times a day (QID) | INTRAVENOUS | Status: DC
  Administered 2019-09-06 – 2019-09-08 (×11): 3 g via INTRAVENOUS
  Filled 2019-09-05 (×4): qty 3
  Filled 2019-09-05: qty 8
  Filled 2019-09-05: qty 3
  Filled 2019-09-05 (×3): qty 8
  Filled 2019-09-05: qty 3
  Filled 2019-09-05: qty 8
  Filled 2019-09-05 (×2): qty 3

## 2019-09-05 MED ORDER — ONDANSETRON HCL 4 MG/2ML IJ SOLN
INTRAMUSCULAR | Status: DC | PRN
  Administered 2019-09-05: 4 mg via INTRAVENOUS

## 2019-09-05 MED ORDER — ROCURONIUM BROMIDE 100 MG/10ML IV SOLN
INTRAVENOUS | Status: DC | PRN
  Administered 2019-09-05: 40 mg via INTRAVENOUS

## 2019-09-05 MED ORDER — LACTATED RINGERS IV SOLN
INTRAVENOUS | Status: DC | PRN
  Administered 2019-09-05 (×2): via INTRAVENOUS

## 2019-09-05 MED ORDER — POVIDONE-IODINE 10 % EX SWAB: 2.0000 "application " | Freq: Once | CUTANEOUS | Status: DC

## 2019-09-05 MED ORDER — VANCOMYCIN HCL 10 G IV SOLR
1500.0000 mg | Freq: Two times a day (BID) | INTRAVENOUS | Status: DC
  Administered 2019-09-06 – 2019-09-08 (×5): 1500 mg via INTRAVENOUS
  Filled 2019-09-05 (×6): qty 1500

## 2019-09-05 MED ORDER — VANCOMYCIN HCL IN DEXTROSE 1-5 GM/200ML-% IV SOLN
INTRAVENOUS | Status: AC
  Filled 2019-09-05: qty 200

## 2019-09-05 MED ORDER — SUCCINYLCHOLINE CHLORIDE 20 MG/ML IJ SOLN
INTRAMUSCULAR | Status: DC | PRN
  Administered 2019-09-05: 140 mg via INTRAVENOUS

## 2019-09-05 MED ORDER — DEXAMETHASONE SODIUM PHOSPHATE 10 MG/ML IJ SOLN
INTRAMUSCULAR | Status: DC | PRN
  Administered 2019-09-05: 8 mg via INTRAVENOUS

## 2019-09-05 MED ORDER — ACETAMINOPHEN 325 MG PO TABS: 650.0000 mg | ORAL_TABLET | Freq: Four times a day (QID) | ORAL | Status: DC | PRN

## 2019-09-05 MED ORDER — LIDOCAINE HCL (CARDIAC) PF 100 MG/5ML IV SOSY
PREFILLED_SYRINGE | INTRAVENOUS | Status: DC | PRN
  Administered 2019-09-05: 80 mg via INTRAVENOUS

## 2019-09-05 MED ORDER — ROCURONIUM BROMIDE 10 MG/ML (PF) SYRINGE
PREFILLED_SYRINGE | INTRAVENOUS | Status: AC
  Filled 2019-09-05: qty 10

## 2019-09-05 MED ORDER — PROPOFOL 10 MG/ML IV BOLUS: INTRAVENOUS | Status: DC | PRN

## 2019-09-05 MED ORDER — PIPERACILLIN-TAZOBACTAM 3.375 G IVPB 30 MIN
3.3750 g | Freq: Once | INTRAVENOUS | Status: AC
  Administered 2019-09-05: 3.375 g via INTRAVENOUS
  Filled 2019-09-05: qty 50

## 2019-09-05 MED ORDER — LACTATED RINGERS IV SOLN
INTRAVENOUS | Status: DC
  Administered 2019-09-05: 14:00:00 via INTRAVENOUS

## 2019-09-05 MED ORDER — ALBUTEROL SULFATE HFA 108 (90 BASE) MCG/ACT IN AERS
INHALATION_SPRAY | RESPIRATORY_TRACT | Status: DC | PRN
  Administered 2019-09-05: 6 via RESPIRATORY_TRACT

## 2019-09-05 MED ORDER — ONDANSETRON HCL 4 MG PO TABS: 4.0000 mg | ORAL_TABLET | Freq: Four times a day (QID) | ORAL | Status: DC | PRN

## 2019-09-05 MED ORDER — POLYETHYLENE GLYCOL 3350 17 G PO PACK: 17.0000 g | PACK | Freq: Every day | ORAL | Status: DC | PRN

## 2019-09-05 MED ORDER — ENOXAPARIN SODIUM 40 MG/0.4ML ~~LOC~~ SOLN
40.0000 mg | SUBCUTANEOUS | Status: DC
  Administered 2019-09-07: 40 mg via SUBCUTANEOUS
  Filled 2019-09-05 (×3): qty 0.4

## 2019-09-05 MED ORDER — METHOCARBAMOL 500 MG PO TABS
500.0000 mg | ORAL_TABLET | Freq: Four times a day (QID) | ORAL | Status: DC | PRN
  Administered 2019-09-05 – 2019-09-07 (×4): 500 mg via ORAL
  Filled 2019-09-05 (×4): qty 1

## 2019-09-05 MED ORDER — SUCCINYLCHOLINE CHLORIDE 200 MG/10ML IV SOSY
PREFILLED_SYRINGE | INTRAVENOUS | Status: AC
  Filled 2019-09-05: qty 10

## 2019-09-05 MED ORDER — CHLORHEXIDINE GLUCONATE 4 % EX LIQD: 60.0000 mL | Freq: Once | CUTANEOUS | Status: DC

## 2019-09-05 MED ORDER — LIDOCAINE 2% (20 MG/ML) 5 ML SYRINGE
INTRAMUSCULAR | Status: AC
  Filled 2019-09-05: qty 5

## 2019-09-05 MED ORDER — FENTANYL CITRATE (PF) 100 MCG/2ML IJ SOLN
25.0000 ug | INTRAMUSCULAR | Status: DC | PRN
  Administered 2019-09-05 (×3): 50 ug via INTRAVENOUS

## 2019-09-05 MED ORDER — DEXAMETHASONE SODIUM PHOSPHATE 10 MG/ML IJ SOLN
INTRAMUSCULAR | Status: AC
  Filled 2019-09-05: qty 1

## 2019-09-05 MED ORDER — CLINDAMYCIN PHOSPHATE 600 MG/50ML IV SOLN: 600.0000 mg | Freq: Once | INTRAVENOUS | Status: DC

## 2019-09-05 MED ORDER — ACETAMINOPHEN 650 MG RE SUPP: 650.0000 mg | Freq: Four times a day (QID) | RECTAL | Status: DC | PRN

## 2019-09-05 MED ORDER — MIDAZOLAM HCL 2 MG/2ML IJ SOLN
INTRAMUSCULAR | Status: DC | PRN
  Administered 2019-09-05: 2 mg via INTRAVENOUS

## 2019-09-05 MED ORDER — HYDROCODONE-ACETAMINOPHEN 7.5-325 MG PO TABS
1.0000 | ORAL_TABLET | ORAL | Status: DC | PRN
  Administered 2019-09-06 (×3): 2 via ORAL
  Filled 2019-09-05 (×3): qty 2

## 2019-09-05 MED ORDER — HYDROCODONE-ACETAMINOPHEN 5-325 MG PO TABS: 1.0000 | ORAL_TABLET | ORAL | Status: DC | PRN

## 2019-09-05 SURGICAL SUPPLY — 69 items
ADAPTER CATH SYR TO TUBING 38M (ADAPTER) ×2 IMPLANT
ADPR CATH LL SYR 3/32 TPR (ADAPTER) ×1
APL PRP STRL LF DISP 70% ISPRP (MISCELLANEOUS) ×1
BNDG CMPR 9X4 STRL LF SNTH (GAUZE/BANDAGES/DRESSINGS) ×1
BNDG COHESIVE 2X5 TAN STRL LF (GAUZE/BANDAGES/DRESSINGS) IMPLANT
BNDG ELASTIC 3X5.8 VLCR STR LF (GAUZE/BANDAGES/DRESSINGS) ×3 IMPLANT
BNDG ELASTIC 4X5.8 VLCR STR LF (GAUZE/BANDAGES/DRESSINGS) ×3 IMPLANT
BNDG ESMARK 4X9 LF (GAUZE/BANDAGES/DRESSINGS) ×3 IMPLANT
BNDG GAUZE ELAST 4 BULKY (GAUZE/BANDAGES/DRESSINGS) ×3 IMPLANT
CHLORAPREP W/TINT 26 (MISCELLANEOUS) ×3 IMPLANT
CORD BIPOLAR FORCEPS 12FT (ELECTRODE) ×3 IMPLANT
COVER SURGICAL LIGHT HANDLE (MISCELLANEOUS) ×3 IMPLANT
COVER WAND RF STERILE (DRAPES) ×1 IMPLANT
CUFF TOURN SGL QUICK 18X4 (TOURNIQUET CUFF) ×3 IMPLANT
CUFF TOURN SGL QUICK 24 (TOURNIQUET CUFF)
CUFF TRNQT CYL 24X4X16.5-23 (TOURNIQUET CUFF) IMPLANT
DECANTER SPIKE VIAL GLASS SM (MISCELLANEOUS) ×3 IMPLANT
DRAIN PENROSE 1/4X12 LTX STRL (WOUND CARE) IMPLANT
DRAPE SURG 17X23 STRL (DRAPES) ×3 IMPLANT
DRSG PAD ABDOMINAL 8X10 ST (GAUZE/BANDAGES/DRESSINGS) ×4 IMPLANT
DRSG XEROFORM 1X8 (GAUZE/BANDAGES/DRESSINGS) ×3 IMPLANT
GAUZE PACKING IODOFORM 1/4X15 (GAUZE/BANDAGES/DRESSINGS) ×2 IMPLANT
GAUZE SPONGE 4X4 12PLY STRL (GAUZE/BANDAGES/DRESSINGS) ×3 IMPLANT
GAUZE XEROFORM 1X8 LF (GAUZE/BANDAGES/DRESSINGS) ×3 IMPLANT
GLOVE BIO SURGEON STRL SZ7.5 (GLOVE) ×6 IMPLANT
GLOVE BIOGEL PI IND STRL 8 (GLOVE) ×1 IMPLANT
GLOVE BIOGEL PI INDICATOR 8 (GLOVE) ×2
GOWN STRL REUS W/ TWL LRG LVL3 (GOWN DISPOSABLE) ×2 IMPLANT
GOWN STRL REUS W/ TWL XL LVL3 (GOWN DISPOSABLE) ×1 IMPLANT
GOWN STRL REUS W/TWL LRG LVL3 (GOWN DISPOSABLE) ×6
GOWN STRL REUS W/TWL XL LVL3 (GOWN DISPOSABLE) ×3
KIT BASIN OR (CUSTOM PROCEDURE TRAY) ×3 IMPLANT
KIT TURNOVER KIT B (KITS) ×3 IMPLANT
LOOP VESSEL MAXI BLUE (MISCELLANEOUS) IMPLANT
MANIFOLD NEPTUNE II (INSTRUMENTS) IMPLANT
NDL HYPO 25X1 1.5 SAFETY (NEEDLE) IMPLANT
NEEDLE HYPO 25GX1X1/2 BEV (NEEDLE) ×3 IMPLANT
NEEDLE HYPO 25X1 1.5 SAFETY (NEEDLE) IMPLANT
NS IRRIG 1000ML POUR BTL (IV SOLUTION) ×3 IMPLANT
PACK ORTHO EXTREMITY (CUSTOM PROCEDURE TRAY) ×3 IMPLANT
PAD ARMBOARD 7.5X6 YLW CONV (MISCELLANEOUS) ×6 IMPLANT
PAD CAST 3X4 CTTN HI CHSV (CAST SUPPLIES) IMPLANT
PADDING CAST ABS 4INX4YD NS (CAST SUPPLIES) ×2
PADDING CAST ABS COTTON 4X4 ST (CAST SUPPLIES) ×1 IMPLANT
PADDING CAST COTTON 3X4 STRL (CAST SUPPLIES) ×3
SET CYSTO W/LG BORE CLAMP LF (SET/KITS/TRAYS/PACK) IMPLANT
SOL PREP POV-IOD 4OZ 10% (MISCELLANEOUS) ×6 IMPLANT
SPLINT PLASTER EXTRA FAST 3X15 (CAST SUPPLIES) ×2
SPLINT PLASTER GYPS XFAST 3X15 (CAST SUPPLIES) ×1 IMPLANT
SPONGE LAP 4X18 RFD (DISPOSABLE) ×3 IMPLANT
SUCTION FRAZIER HANDLE 10FR (MISCELLANEOUS) ×2
SUCTION TUBE FRAZIER 10FR DISP (MISCELLANEOUS) ×1 IMPLANT
SUT ETHILON 3 0 PS 1 (SUTURE) ×3 IMPLANT
SUT ETHILON 4 0 P 3 18 (SUTURE) IMPLANT
SUT ETHILON 4 0 PS 2 18 (SUTURE) IMPLANT
SUT MON AB 5-0 P3 18 (SUTURE) IMPLANT
SUT VIC AB 3-0 SH 27 (SUTURE) ×3
SUT VIC AB 3-0 SH 27X BRD (SUTURE) ×1 IMPLANT
SWAB COLLECTION DEVICE MRSA (MISCELLANEOUS) IMPLANT
SWAB CULTURE ESWAB REG 1ML (MISCELLANEOUS) IMPLANT
SYR CONTROL 10ML LL (SYRINGE) ×3 IMPLANT
TOWEL GREEN STERILE (TOWEL DISPOSABLE) ×3 IMPLANT
TOWEL GREEN STERILE FF (TOWEL DISPOSABLE) ×3 IMPLANT
TUBE CONNECTING 12'X1/4 (SUCTIONS) ×1
TUBE CONNECTING 12X1/4 (SUCTIONS) ×2 IMPLANT
TUBE FEEDING ENTERAL 5FR 16IN (TUBING) ×2 IMPLANT
UNDERPAD 30X30 (UNDERPADS AND DIAPERS) ×3 IMPLANT
WATER STERILE IRR 1000ML POUR (IV SOLUTION) ×3 IMPLANT
YANKAUER SUCT BULB TIP NO VENT (SUCTIONS) ×3 IMPLANT

## 2019-09-05 NOTE — Anesthesia Postprocedure Evaluation (Signed)
Anesthesia Post Note  Patient: Connor Porter  Procedure(s) Performed: IRRIGATION AND DEBRIDEMENT EXTREMITY (Left )     Patient location during evaluation: PACU Anesthesia Type: General Level of consciousness: awake and alert Pain management: pain level controlled Vital Signs Assessment: post-procedure vital signs reviewed and stable Respiratory status: spontaneous breathing, nonlabored ventilation and respiratory function stable Cardiovascular status: blood pressure returned to baseline and stable Postop Assessment: no apparent nausea or vomiting Anesthetic complications: no    Last Vitals:  Vitals:   09/05/19 1945 09/05/19 2010  BP: (!) 143/99 (!) 148/115  Pulse: 60 (!) 56  Resp: 20 18  Temp:  36.9 C  SpO2: 94% 100%    Last Pain:  Vitals:   09/05/19 2010  TempSrc: Oral  PainSc:                  Audry Pili
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
## 2019-09-05 NOTE — Progress Notes (Signed)
Postop note Status post incision and drainage left hand including MP joint and flexor tendon sheath.  Devitalized tissue debrided.  Cultures taken both by swab and bone culture sent.  Wounds packed.  Start hydrotherapy in 2 to 3 days.  Recommend infectious disease consultation for antibiotic selection and duration given the osteomyelitis.  Follow-up in office early next week if discharged over the weekend.  We can resume hydrotherapy there.
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
## 2019-09-05 NOTE — Anesthesia Preprocedure Evaluation (Signed)
Anesthesia Evaluation  Patient identified by MRN, date of birth, ID band Patient awake    Reviewed: Allergy & Precautions, NPO status , Patient's Chart, lab work & pertinent test results  Airway Mallampati: II  TM Distance: >3 FB     Dental  (+) Dental Advisory Given   Pulmonary Current Smoker,    breath sounds clear to auscultation       Cardiovascular negative cardio ROS   Rhythm:Regular Rate:Normal     Neuro/Psych negative neurological ROS     GI/Hepatic negative GI ROS, Neg liver ROS,   Endo/Other  negative endocrine ROS  Renal/GU negative Renal ROS     Musculoskeletal   Abdominal   Peds  Hematology negative hematology ROS (+)   Anesthesia Other Findings   Reproductive/Obstetrics                             Lab Results  Component Value Date   WBC 6.2 09/05/2019   HGB 14.7 09/05/2019   HCT 42.8 09/05/2019   MCV 94.5 09/05/2019   PLT 340 09/05/2019   Lab Results  Component Value Date   CREATININE 1.05 09/05/2019   BUN 14 09/05/2019   NA 139 09/05/2019   K 3.6 09/05/2019   CL 106 09/05/2019   CO2 22 09/05/2019    Anesthesia Physical Anesthesia Plan  ASA: II  Anesthesia Plan: General   Post-op Pain Management:    Induction: Intravenous  PONV Risk Score and Plan: 1 and Dexamethasone, Ondansetron and Treatment may vary due to age or medical condition  Airway Management Planned: LMA  Additional Equipment:   Intra-op Plan:   Post-operative Plan: Extubation in OR  Informed Consent: I have reviewed the patients History and Physical, chart, labs and discussed the procedure including the risks, benefits and alternatives for the proposed anesthesia with the patient or authorized representative who has indicated his/her understanding and acceptance.     Dental advisory given  Plan Discussed with:   Anesthesia Plan Comments:         Anesthesia Quick  Evaluation
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
## 2019-09-05 NOTE — ED Notes (Signed)
Pt refused blood work  
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
## 2019-09-05 NOTE — Transfer of Care (Signed)
Immediate Anesthesia Transfer of Care Note  Patient: Connor Porter  Procedure(s) Performed: IRRIGATION AND DEBRIDEMENT EXTREMITY (Left )  Patient Location: PACU  Anesthesia Type:General  Level of Consciousness: awake, alert , oriented and patient cooperative  Airway & Oxygen Therapy: Patient Spontanous Breathing and Patient connected to nasal cannula oxygen  Post-op Assessment: Report given to RN, Post -op Vital signs reviewed and stable and Patient moving all extremities X 4  Post vital signs: Reviewed and stable  Last Vitals:  Vitals Value Taken Time  BP 148/105 09/05/19 1913  Temp    Pulse 81 09/05/19 1914  Resp 19 09/05/19 1914  SpO2 99 % 09/05/19 1914  Vitals shown include unvalidated device data.  Last Pain:  Vitals:   09/05/19 1417  TempSrc:   PainSc: 8          Complications: No apparent anesthesia complications

## 2019-09-05 NOTE — Anesthesia Procedure Notes (Signed)
Procedure Name: LMA Insertion Date/Time: 09/05/2019 5:29 PM Performed by: Inda Coke, CRNA Pre-anesthesia Checklist: Patient identified, Emergency Drugs available, Suction available and Patient being monitored Patient Re-evaluated:Patient Re-evaluated prior to induction Oxygen Delivery Method: Circle System Utilized Preoxygenation: Pre-oxygenation with 100% oxygen Induction Type: IV induction Ventilation: Mask ventilation without difficulty LMA: LMA inserted LMA Size: 4.0 Number of attempts: 1 Airway Equipment and Method: Bite block Placement Confirmation: positive ETCO2 Tube secured with: Tape Dental Injury: Teeth and Oropharynx as per pre-operative assessment

## 2019-09-05 NOTE — Anesthesia Procedure Notes (Signed)
Procedure Name: Intubation Date/Time: 09/05/2019 5:51 PM Performed by: Inda Coke, CRNA Pre-anesthesia Checklist: Patient identified, Emergency Drugs available, Suction available and Patient being monitored Patient Re-evaluated:Patient Re-evaluated prior to induction Oxygen Delivery Method: Circle System Utilized Preoxygenation: Pre-oxygenation with 100% oxygen Induction Type: IV induction Ventilation: Mask ventilation without difficulty Laryngoscope Size: Glidescope and 4 Grade View: Grade I Tube type: Oral Tube size: 7.0 mm Number of attempts: 1 Airway Equipment and Method: Stylet and Oral airway Placement Confirmation: ETT inserted through vocal cords under direct vision,  positive ETCO2 and breath sounds checked- equal and bilateral Secured at: 23 cm Tube secured with: Tape Dental Injury: Teeth and Oropharynx as per pre-operative assessment  Difficulty Due To: Difficult Airway- due to anterior larynx

## 2019-09-05 NOTE — ED Notes (Signed)
Tech attempted to draw blood on pt. Pt refused and stated, "nah I dont want to get stuck I need to go smoke." pt was agitated. Tech notified nurse.

## 2019-09-05 NOTE — Discharge Instructions (Signed)

## 2019-09-05 NOTE — ED Provider Notes (Signed)
I saw and evaluated the patient, reviewed the resident's note and I agree with the findings and plan.  EKG:    Exam is concerning for deep space infection of the hand.  X-ray suggest osteomyelitis and septic arthritis.  Consultation with orthopedic hand surgery.  Plan on admission for further treatment.   Dorie Rank, MD 09/05/19 1024

## 2019-09-06 ENCOUNTER — Encounter (HOSPITAL_COMMUNITY): Payer: Self-pay | Admitting: Orthopedic Surgery

## 2019-09-06 DIAGNOSIS — Z8781 Personal history of (healed) traumatic fracture: Secondary | ICD-10-CM

## 2019-09-06 DIAGNOSIS — M869 Osteomyelitis, unspecified: Secondary | ICD-10-CM

## 2019-09-06 DIAGNOSIS — M009 Pyogenic arthritis, unspecified: Secondary | ICD-10-CM

## 2019-09-06 LAB — CBC WITH DIFFERENTIAL/PLATELET
Abs Immature Granulocytes: 0.03 10*3/uL (ref 0.00–0.07)
Basophils Absolute: 0 10*3/uL (ref 0.0–0.1)
Basophils Relative: 0 %
Eosinophils Absolute: 0 10*3/uL (ref 0.0–0.5)
Eosinophils Relative: 0 %
HCT: 39.2 % (ref 39.0–52.0)
Hemoglobin: 13.9 g/dL (ref 13.0–17.0)
Immature Granulocytes: 0 %
Lymphocytes Relative: 5 %
Lymphs Abs: 0.7 10*3/uL (ref 0.7–4.0)
MCH: 32.5 pg (ref 26.0–34.0)
MCHC: 35.5 g/dL (ref 30.0–36.0)
MCV: 91.6 fL (ref 80.0–100.0)
Monocytes Absolute: 0.2 10*3/uL (ref 0.1–1.0)
Monocytes Relative: 2 %
Neutro Abs: 11.4 10*3/uL — ABNORMAL HIGH (ref 1.7–7.7)
Neutrophils Relative %: 93 %
Platelets: 335 10*3/uL (ref 150–400)
RBC: 4.28 MIL/uL (ref 4.22–5.81)
RDW: 13.6 % (ref 11.5–15.5)
WBC: 12.3 10*3/uL — ABNORMAL HIGH (ref 4.0–10.5)
nRBC: 0 % (ref 0.0–0.2)

## 2019-09-06 LAB — BASIC METABOLIC PANEL
Anion gap: 9 (ref 5–15)
BUN: 11 mg/dL (ref 6–20)
CO2: 23 mmol/L (ref 22–32)
Calcium: 9.3 mg/dL (ref 8.9–10.3)
Chloride: 105 mmol/L (ref 98–111)
Creatinine, Ser: 1.11 mg/dL (ref 0.61–1.24)
GFR calc Af Amer: >60 mL/min (ref >60)
GFR calc non Af Amer: >60 mL/min (ref >60)
Glucose, Bld: 192 mg/dL — ABNORMAL HIGH (ref 70–99)
Potassium: 4.1 mmol/L (ref 3.5–5.1)
Sodium: 137 mmol/L (ref 135–145)

## 2019-09-06 MED ORDER — POLYETHYLENE GLYCOL 3350 17 G PO PACK
17.0000 g | PACK | Freq: Two times a day (BID) | ORAL | Status: DC
  Administered 2019-09-06 – 2019-09-07 (×2): 17 g via ORAL
  Filled 2019-09-06 (×4): qty 1

## 2019-09-06 MED ORDER — OXYCODONE HCL 5 MG PO TABS
5.0000 mg | ORAL_TABLET | Freq: Four times a day (QID) | ORAL | Status: DC | PRN
  Administered 2019-09-06 – 2019-09-08 (×4): 5 mg via ORAL
  Filled 2019-09-06 (×5): qty 1
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
## 2019-09-06 NOTE — Progress Notes (Signed)
Subjective: 1 Day Post-Op Procedure(s) (LRB): IRRIGATION AND DEBRIDEMENT EXTREMITY (Left) Patient reports pain as controlled.  Objective: Vital signs in last 24 hours: Temp:  [97.2 F (36.2 C)-98.5 F (36.9 C)] 98.2 F (36.8 C) (10/23 0826) Pulse Rate:  [53-81] 56 (10/23 0826) Resp:  [15-20] 16 (10/23 0826) BP: (137-158)/(79-115) 137/79 (10/23 0826) SpO2:  [93 %-100 %] 100 % (10/23 0826)  Intake/Output from previous day: 10/22 0701 - 10/23 0700 In: 1981.6 [P.O.:360; I.V.:1421.6; IV Piggyback:200] Out: 503 [Urine:500; Blood:3] Intake/Output this shift: No intake/output data recorded.  Recent Labs    09/05/19 0516 09/06/19 0204  HGB 14.7 13.9   Recent Labs    09/05/19 0516 09/06/19 0204  WBC 6.2 12.3*  RBC 4.53 4.28  HCT 42.8 39.2  PLT 340 335   Recent Labs    09/05/19 0516 09/06/19 0204  NA 139 137  K 3.6 4.1  CL 106 105  CO2 22 23  BUN 14 11  CREATININE 1.05 1.11  GLUCOSE 138* 192*  CALCIUM 9.0 9.3   No results for input(s): LABPT, INR in the last 72 hours.  Intact sensation and capillary refill all digits.  Dressing clean/dry/intact   Assessment/Plan: 1 Day Post-Op Procedure(s) (LRB): IRRIGATION AND DEBRIDEMENT EXTREMITY (Left)  Continue abx.  Start hydrotherapy tomorrow if still admitted.  Okay for d/c home from hand standpoint when WBC coming down, afebrile, pain controlled, antibiotics selected.  Can resume hydrotherapy in office.   Leanora Cover 09/06/2019, 12:30 PM

## 2019-09-06 NOTE — Progress Notes (Signed)
  CPAP set up at bedside with FFM, auto settings (max 16.0, 55min 5.0) cm H20.  Patient not ready to place on at this time. Patient encouragaed to call for assistance. RN aware.

## 2019-09-06 NOTE — Plan of Care (Signed)
  Problem: Education: Goal: Knowledge of General Education information will improve Description: Including pain rating scale, medication(s)/side effects and non-pharmacologic comfort measures Outcome: Progressing   Problem: Pain Managment: Goal: General experience of comfort will improve Outcome: Progressing   Problem: Safety: Goal: Ability to remain free from injury will improve Outcome: Progressing   
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
## 2019-09-07 LAB — BASIC METABOLIC PANEL
Anion gap: 8 (ref 5–15)
BUN: 10 mg/dL (ref 6–20)
CO2: 24 mmol/L (ref 22–32)
Calcium: 8.7 mg/dL — ABNORMAL LOW (ref 8.9–10.3)
Chloride: 108 mmol/L (ref 98–111)
Creatinine, Ser: 0.98 mg/dL (ref 0.61–1.24)
GFR calc Af Amer: >60 mL/min (ref >60)
GFR calc non Af Amer: >60 mL/min (ref >60)
Glucose, Bld: 110 mg/dL — ABNORMAL HIGH (ref 70–99)
Potassium: 4 mmol/L (ref 3.5–5.1)
Sodium: 140 mmol/L (ref 135–145)

## 2019-09-07 LAB — CBC
HCT: 36.6 % — ABNORMAL LOW (ref 39.0–52.0)
Hemoglobin: 12.8 g/dL — ABNORMAL LOW (ref 13.0–17.0)
MCH: 32.2 pg (ref 26.0–34.0)
MCHC: 35 g/dL (ref 30.0–36.0)
MCV: 92.2 fL (ref 80.0–100.0)
Platelets: 297 10*3/uL (ref 150–400)
RBC: 3.97 MIL/uL — ABNORMAL LOW (ref 4.22–5.81)
RDW: 13.6 % (ref 11.5–15.5)
WBC: 6.8 10*3/uL (ref 4.0–10.5)
nRBC: 0 % (ref 0.0–0.2)

## 2019-09-07 LAB — HEMOGLOBIN A1C
Hgb A1c MFr Bld: 5.8 % — ABNORMAL HIGH (ref 4.8–5.6)
Mean Plasma Glucose: 119.76 mg/dL

## 2019-09-07 NOTE — Progress Notes (Signed)
Physical Therapy Wound Evaluation/Treatment Patient Details  Name: Connor Porter MRN: [id number] Date of Birth: 1981/01/04  Today's Date: 09/07/2019 Time: 1115-1220 Time Calculation (min): 65 min  Subjective  Subjective: Pt reports pain has been controlled. Pleasant and agreeable to hydrotherapy.  Patient and Family Stated Goals: Heal hand Prior Treatments: Unfinished course of antibiotics prior to presentation to ED  Pain Score: Premedicated however pt still reports significant pain during treatment.  Wound Assessment  Wound / Incision (Open or Dehisced) 09/07/19 Incision - Open Hand Other (Comment) Ulnar aspect of L hand - dorsal (Active)  Wound Image   09/07/19 1100  Dressing Type Compression wrap;Gauze (Comment);Moist to dry 09/07/19 1100  Dressing Changed Changed 09/07/19 1100  Dressing Status Clean;Dry;Intact 09/07/19 1100  Dressing Change Frequency Daily 09/07/19 1100  Site / Wound Assessment Bleeding;Red 09/07/19 1100  % Wound base Red or Granulating 100% 09/07/19 1100  % Wound base Yellow/Fibrinous Exudate 0% 09/07/19 1100  % Wound base Black/Eschar 0% 09/07/19 1100  % Wound base Other/Granulation Tissue (Comment) 0% 09/07/19 1100  Peri-wound Assessment Intact;Edema 09/07/19 1100  Wound Length (cm) 5 cm 09/07/19 1100  Wound Width (cm) 1 cm 09/07/19 1100  Wound Depth (cm) 0.4 cm 09/07/19 1100  Wound Volume (cm^3) 2 cm^3 09/07/19 1100  Wound Surface Area (cm^2) 5 cm^2 09/07/19 1100  Tunneling (cm) 0 09/07/19 1100  Undermining (cm) 9:00 1.5 cm  09/07/19 1100  Margins Unattached edges (unapproximated) 09/07/19 1100  Closure None 09/07/19 1100  Drainage Amount Moderate 09/07/19 1100  Drainage Description Sanguineous 09/07/19 1100  Treatment Hydrotherapy (Pulse lavage);Packing (Impregnated strip) 09/07/19 1100     Wound / Incision (Open or Dehisced) 09/07/19 Incision - Open Hand Ulnar aspect of L hand - palmar (Active)  Wound Image   09/07/19 1100  Dressing Type  Compression wrap;Gauze (Comment);Moist to dry 09/07/19 1100  Dressing Changed Changed 09/07/19 1100  Dressing Status Clean;Dry;Intact 09/07/19 1100  Dressing Change Frequency Daily 09/07/19 1100  Site / Wound Assessment Red;Bleeding 09/07/19 1100  % Wound base Red or Granulating 100% 09/07/19 1100  % Wound base Yellow/Fibrinous Exudate 0% 09/07/19 1100  % Wound base Black/Eschar 0% 09/07/19 1100  % Wound base Other/Granulation Tissue (Comment) 0% 09/07/19 1100  Peri-wound Assessment Maceration 09/07/19 1100  Wound Length (cm) 2.8 cm 09/07/19 1100  Wound Width (cm) 1 cm 09/07/19 1100  Wound Depth (cm) 1 cm 09/07/19 1100  Wound Volume (cm^3) 2.8 cm^3 09/07/19 1100  Wound Surface Area (cm^2) 2.8 cm^2 09/07/19 1100  Tunneling (cm) 0 09/07/19 1100  Undermining (cm) 2.5 cm 3:00 09/07/19 1100  Margins Unattached edges (unapproximated) 09/07/19 1100  Closure None 09/07/19 1100  Drainage Amount Moderate 09/07/19 1100  Drainage Description Sanguineous 09/07/19 1100  Treatment Hydrotherapy (Pulse lavage);Packing (Impregnated strip) 09/07/19 1100   Hydrotherapy Pulsed lavage therapy - wound location: L hand, dorsal and palmar wounds Pulsed Lavage with Suction (psi): 4 psi Pulsed Lavage with Suction - Normal Saline Used: 1000 mL Pulsed Lavage Tip: Tip with splash shield   Wound Assessment and Plan  Wound Therapy - Assess/Plan/Recommendations Wound Therapy - Clinical Statement: Pt presents to hydrotherapy s/p I&D of L hand on 09/05/2019. Pt pleasant and agreeable to hydrotherapy. He was premedicated however pain was still a limiting factor. This patient will benefit from continued hydrotherapy for pulsed-lavage to decrease bioburden and promote wound bed healing.  Wound Therapy - Functional Problem List: Decreased strength and AROM of fingers and wrist, and acute pain.  Factors Delaying/Impairing Wound Healing: Infection - systemic/local Hydrotherapy  Plan: Debridement;Dressing  change;Patient/family education;Pulsatile lavage with suction Wound Therapy - Frequency: 6X / week Wound Therapy - Follow Up Recommendations: Other (Per Dr. Fredna Dow - may continue hydrotherapy in office at d/c) Wound Plan: See above  Wound Therapy Goals- Improve the function of patient's integumentary system by progressing the wound(s) through the phases of wound healing (inflammation - proliferation - remodeling) by: Decrease Length/Width/Depth by (cm): 1 cm Decrease Length/Width/Depth - Progress: Goal set today Improve Drainage Characteristics: Min;Serous Improve Drainage Characteristics - Progress: Goal set today Goals/treatment plan/discharge plan were made with and agreed upon by patient/family: Yes Time For Goal Achievement: 7 days Wound Therapy - Potential for Goals: Excellent  Goals will be updated until maximal potential achieved or discharge criteria met.  Discharge criteria: when goals achieved, discharge from hospital, MD decision/surgical intervention, no progress towards goals, refusal/missing three consecutive treatments without notification or medical reason.  GP     Thelma Comp 09/07/2019, 1:27 PM   Rolinda Roan, PT, DPT Acute Rehabilitation Services Pager: [phone] Office: [phone]

## 2019-09-07 NOTE — Progress Notes (Signed)
Patient stated he will place himself on CPAP when he is ready. RT placed CPAP at patients bedside and filled with sterile water. RT instructed patient to have RT called if assistance is required. RT will monitor as needed.

## 2019-09-07 NOTE — Progress Notes (Signed)
ID PROGRESS NOTE  Afebrile, underwent wound care, hydrotherapy. Large incision to left hand.   Cultures are reincubating, awaiting Identification so that we may finalize antibiotic course  Plan: continue on vancomycin plus amp/sub for now Will follow up with micro lab tomorrow to see if identification is finalized  Caren Griffins B. [facility] for Infectious Diseases [phone]

## 2019-09-08 DIAGNOSIS — M86142 Other acute osteomyelitis, left hand: Secondary | ICD-10-CM

## 2019-09-08 DIAGNOSIS — Z87828 Personal history of other (healed) physical injury and trauma: Secondary | ICD-10-CM

## 2019-09-08 LAB — CBC
HCT: 40.6 % (ref 39.0–52.0)
Hemoglobin: 14.4 g/dL (ref 13.0–17.0)
MCH: 32.7 pg (ref 26.0–34.0)
MCHC: 35.5 g/dL (ref 30.0–36.0)
MCV: 92.1 fL (ref 80.0–100.0)
Platelets: 330 10*3/uL (ref 150–400)
RBC: 4.41 MIL/uL (ref 4.22–5.81)
RDW: 13.3 % (ref 11.5–15.5)
WBC: 4 10*3/uL (ref 4.0–10.5)
nRBC: 0 % (ref 0.0–0.2)

## 2019-09-08 MED ORDER — LEVOFLOXACIN 750 MG PO TABS: 750.0000 mg | ORAL_TABLET | Freq: Every day | ORAL | 0 refills | Status: AC

## 2019-09-08 MED ORDER — METRONIDAZOLE 500 MG PO TABS
500.0000 mg | ORAL_TABLET | Freq: Three times a day (TID) | ORAL | Status: DC
  Administered 2019-09-08: 500 mg via ORAL
  Filled 2019-09-08: qty 1

## 2019-09-08 MED ORDER — METRONIDAZOLE 500 MG PO TABS: 500.0000 mg | ORAL_TABLET | Freq: Three times a day (TID) | ORAL | 0 refills | Status: AC

## 2019-09-08 MED ORDER — OXYCODONE HCL 5 MG PO TABS: 5.0000 mg | ORAL_TABLET | Freq: Four times a day (QID) | ORAL | 0 refills | Status: AC | PRN

## 2019-09-08 MED ORDER — LEVOFLOXACIN 500 MG PO TABS
750.0000 mg | ORAL_TABLET | Freq: Every day | ORAL | Status: DC
  Administered 2019-09-08: 750 mg via ORAL
  Filled 2019-09-08: qty 2
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
## 2019-09-08 NOTE — Care Management (Signed)
Patient given [facility] letter with override for oxycodone.

## 2019-09-08 NOTE — Plan of Care (Signed)
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
## 2019-09-08 NOTE — Progress Notes (Signed)
RN reviewed AVS with pt, all questions answered to satisfaction. Pt IVs removed and belongings gathered. Pt called ride and is ready for discharge.  Talon Witting Elon Spanner, RN 09/08/2019 6:12 PM

## 2019-09-10 LAB — AEROBIC/ANAEROBIC CULTURE W GRAM STAIN (SURGICAL/DEEP WOUND)

## 2019-09-10 LAB — CULTURE, BLOOD (ROUTINE X 2)
Culture: NO GROWTH
Culture: NO GROWTH
Special Requests: ADEQUATE

## 2019-09-24 ENCOUNTER — Inpatient Hospital Stay: Payer: Self-pay | Admitting: Internal Medicine

## 2020-07-31 IMAGING — CR DG HAND COMPLETE 3+V*L*
3 series · 3 of 3 positions shown · non-contrast
Comparison: None.

CLINICAL DATA: Swelling and injury. Skin infection with purulent
drainage for 1 month

EXAM:
LEFT HAND - COMPLETE 3+ VIEW

[hand pa]
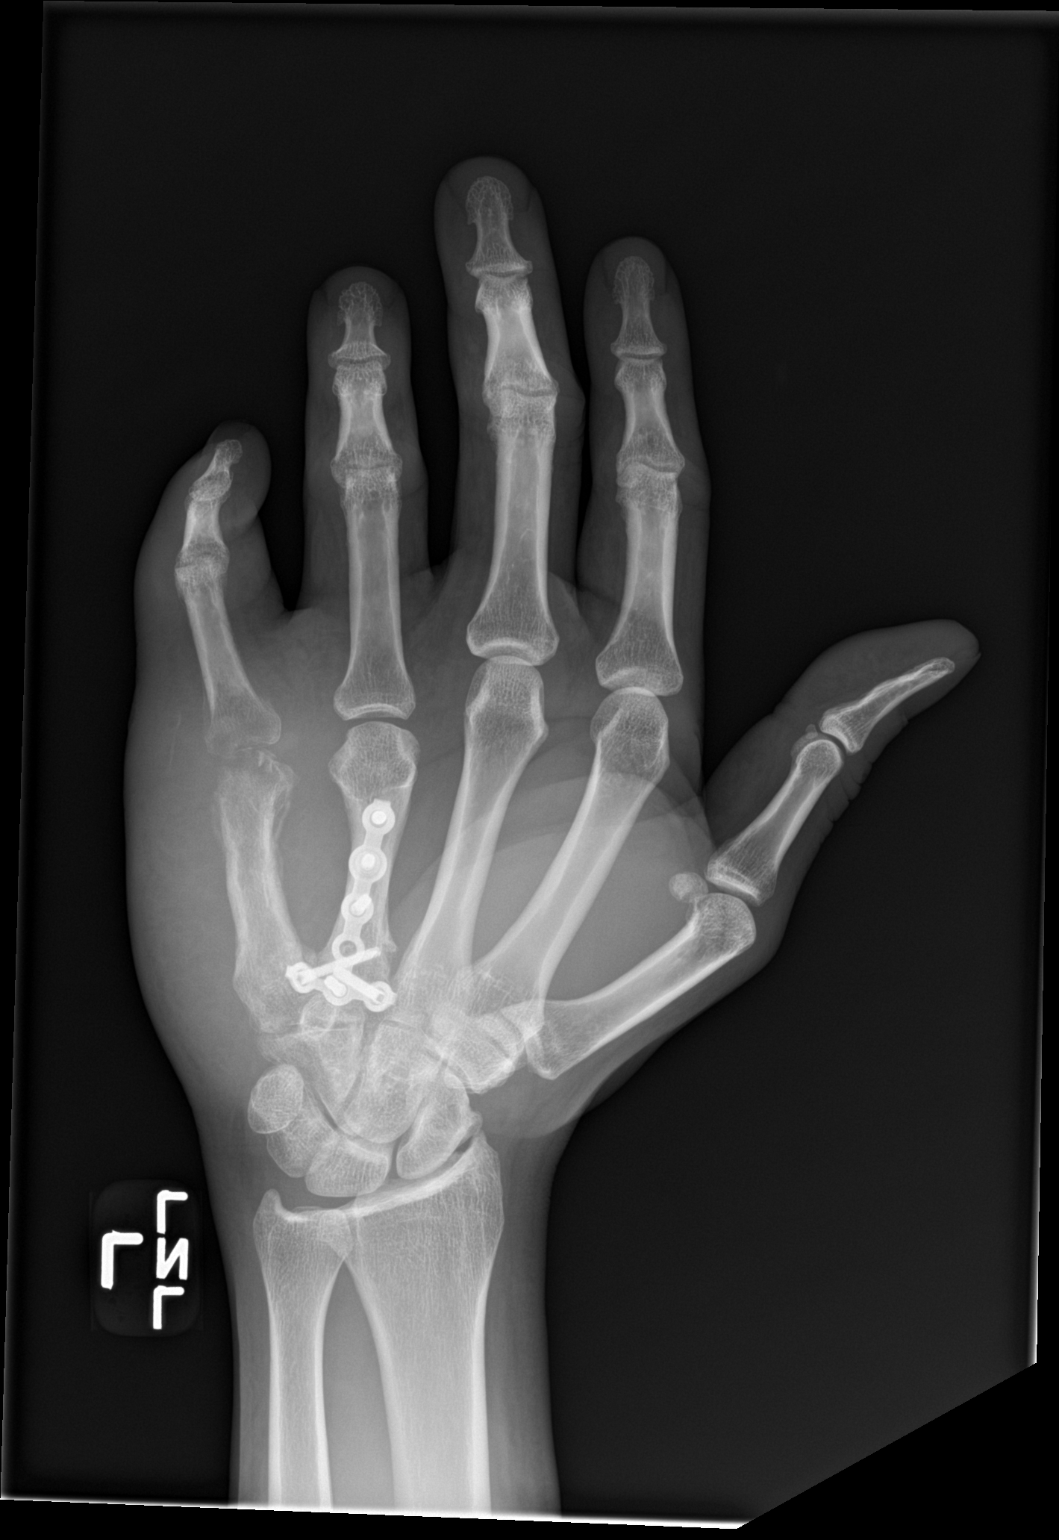

[hand obl]
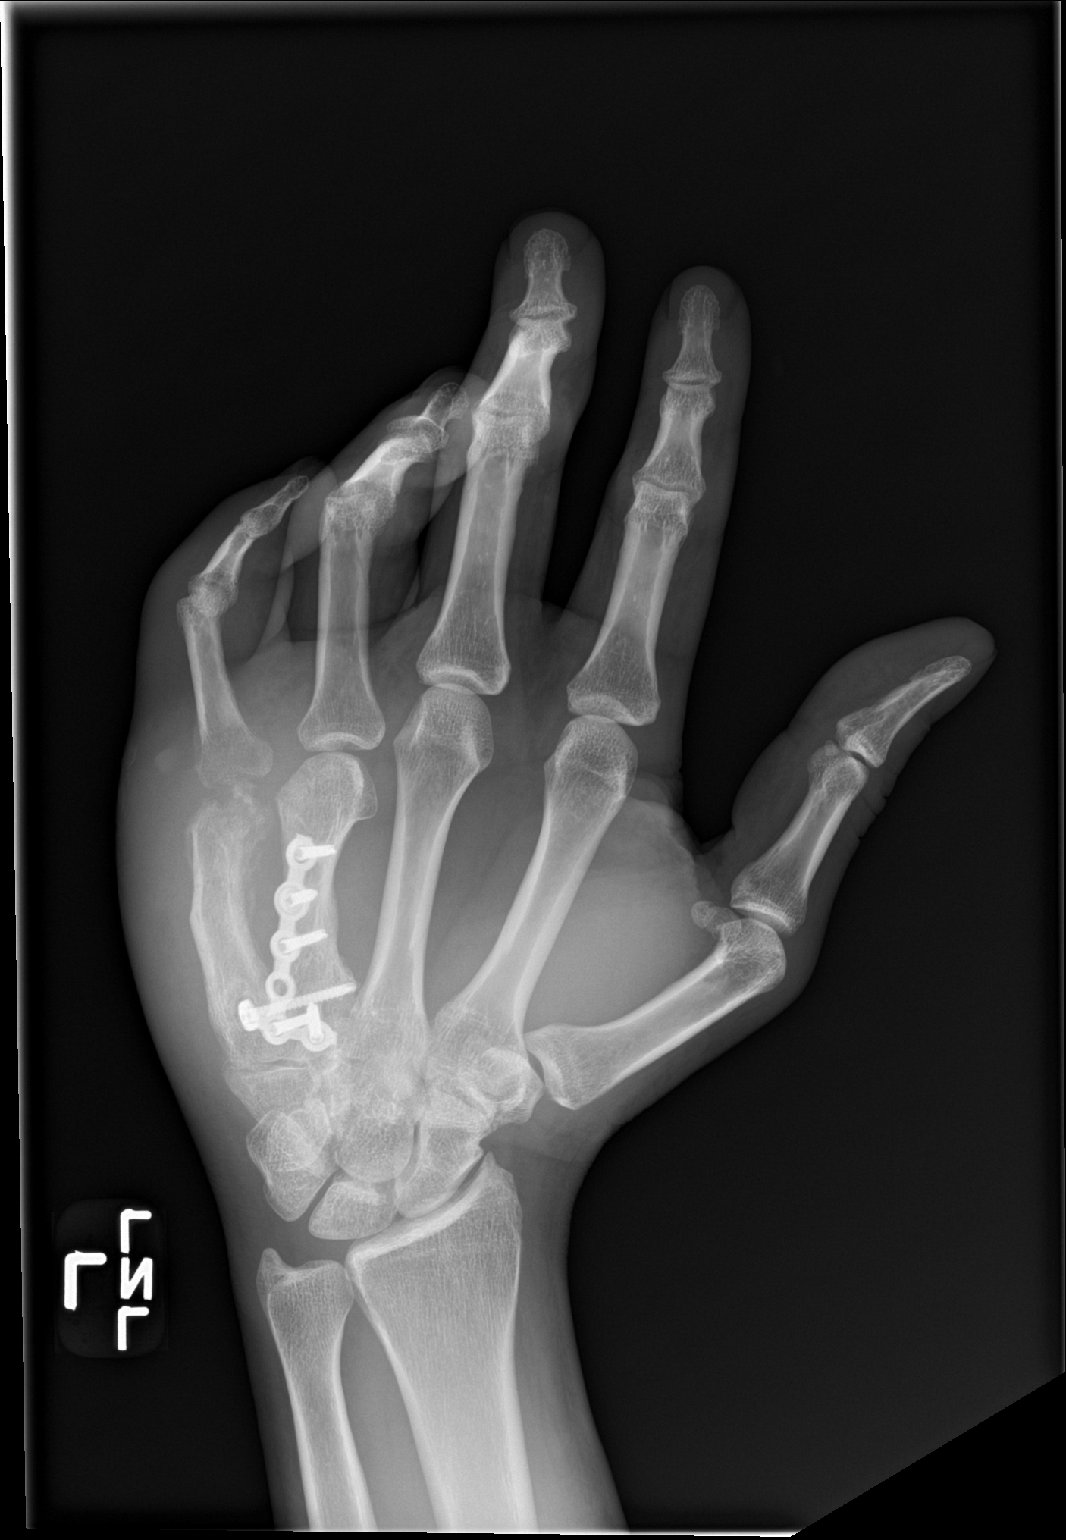

[hand lat]
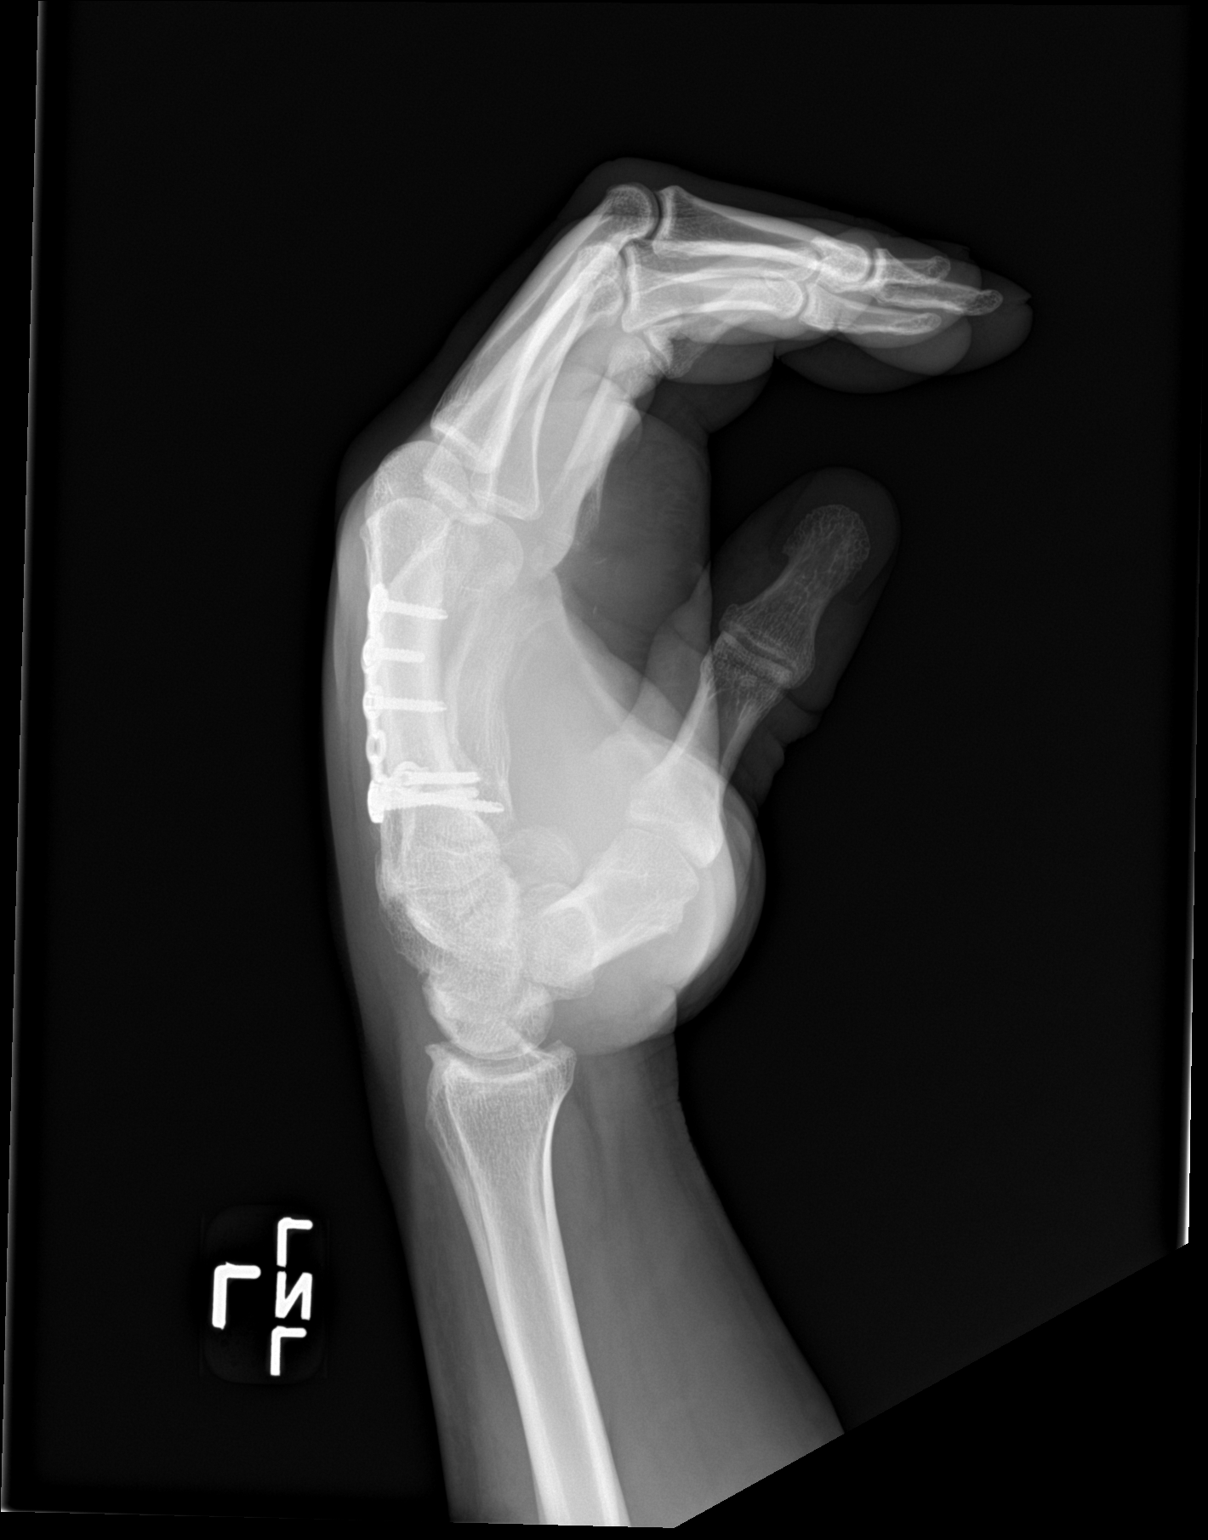

[3 of 3 positions shown; findings below may reference images not displayed]

FINDINGS: Disorganized periosteal reaction, osteopenia, and central erosion on
both sides of the fifth MCP joint with regional soft tissue
swelling. No opaque foreign body.

Prior fourth and fifth metacarpal fractures with fourth metacarpal
plate.
IMPRESSION: 1. Findings of septic arthritis and osteomyelitis about the fifth
MCP joint.
2. Remote fourth and fifth metacarpal fractures with fourth
metacarpal plating. No erosions seen around the plate.
